# Patient Record
Sex: Male | Born: 1985 | Race: White | Hispanic: No | Marital: Single | Smoking: Current some day smoker
Health system: Southern US, Community
[De-identification: ages and names within clinical notes are randomized; demographics above are authoritative.]

---

## 2011-11-13 ENCOUNTER — Emergency Department: Payer: Self-pay | Admitting: *Deleted

## 2017-12-19 ENCOUNTER — Emergency Department: Payer: BLUE CROSS/BLUE SHIELD

## 2017-12-19 ENCOUNTER — Encounter: Payer: Self-pay | Admitting: Emergency Medicine

## 2017-12-19 ENCOUNTER — Other Ambulatory Visit: Payer: Self-pay

## 2017-12-19 ENCOUNTER — Emergency Department
Admission: EM | Admit: 2017-12-19 | Discharge: 2017-12-19 | Disposition: A | Discharge status: Home / Self Care | Payer: BLUE CROSS/BLUE SHIELD | Attending: Emergency Medicine | Admitting: Emergency Medicine

## 2017-12-19 DIAGNOSIS — Y999 Unspecified external cause status: Secondary | ICD-10-CM | POA: Insufficient documentation

## 2017-12-19 DIAGNOSIS — F17200 Nicotine dependence, unspecified, uncomplicated: Secondary | ICD-10-CM | POA: Insufficient documentation

## 2017-12-19 DIAGNOSIS — M542 Cervicalgia: Secondary | ICD-10-CM | POA: Insufficient documentation

## 2017-12-19 DIAGNOSIS — Y9389 Activity, other specified: Secondary | ICD-10-CM | POA: Diagnosis not present

## 2017-12-19 DIAGNOSIS — Y92410 Unspecified street and highway as the place of occurrence of the external cause: Secondary | ICD-10-CM | POA: Insufficient documentation

## 2017-12-19 DIAGNOSIS — M7918 Myalgia, other site: Secondary | ICD-10-CM | POA: Diagnosis not present

## 2017-12-19 MED ORDER — CYCLOBENZAPRINE HCL 10 MG PO TABS
10.0000 mg | ORAL_TABLET | Freq: Once | ORAL | Status: AC
  Administered 2017-12-19: 10 mg via ORAL
  Filled 2017-12-19: qty 1

## 2017-12-19 MED ORDER — HYDROCODONE-ACETAMINOPHEN 5-325 MG PO TABS
1.0000 | ORAL_TABLET | Freq: Once | ORAL | Status: AC
  Administered 2017-12-19: 1 via ORAL
  Filled 2017-12-19: qty 1

## 2017-12-19 MED ORDER — HYDROCODONE-ACETAMINOPHEN 5-325 MG PO TABS: 1.0000 | ORAL_TABLET | Freq: Four times a day (QID) | ORAL | 0 refills | Status: DC | PRN

## 2017-12-19 MED ORDER — KETOROLAC TROMETHAMINE 10 MG PO TABS: 10.0000 mg | ORAL_TABLET | Freq: Three times a day (TID) | ORAL | 0 refills | Status: AC

## 2017-12-19 MED ORDER — CYCLOBENZAPRINE HCL 5 MG PO TABS: 5.0000 mg | ORAL_TABLET | Freq: Three times a day (TID) | ORAL | 0 refills | Status: AC | PRN

## 2017-12-19 MED ORDER — KETOROLAC TROMETHAMINE 30 MG/ML IJ SOLN
30.0000 mg | Freq: Once | INTRAMUSCULAR | Status: AC
  Administered 2017-12-19: 30 mg via INTRAMUSCULAR
  Filled 2017-12-19: qty 1
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
## 2017-12-22 ENCOUNTER — Emergency Department
Admission: EM | Admit: 2017-12-22 | Discharge: 2017-12-22 | Disposition: A | Discharge status: Home / Self Care | Payer: BLUE CROSS/BLUE SHIELD | Attending: Emergency Medicine | Admitting: Emergency Medicine

## 2017-12-22 ENCOUNTER — Emergency Department: Payer: BLUE CROSS/BLUE SHIELD

## 2017-12-22 ENCOUNTER — Other Ambulatory Visit: Payer: Self-pay

## 2017-12-22 DIAGNOSIS — Y999 Unspecified external cause status: Secondary | ICD-10-CM | POA: Insufficient documentation

## 2017-12-22 DIAGNOSIS — Y92410 Unspecified street and highway as the place of occurrence of the external cause: Secondary | ICD-10-CM | POA: Insufficient documentation

## 2017-12-22 DIAGNOSIS — F17200 Nicotine dependence, unspecified, uncomplicated: Secondary | ICD-10-CM | POA: Insufficient documentation

## 2017-12-22 DIAGNOSIS — Y9389 Activity, other specified: Secondary | ICD-10-CM | POA: Diagnosis not present

## 2017-12-22 DIAGNOSIS — R1012 Left upper quadrant pain: Secondary | ICD-10-CM | POA: Diagnosis not present

## 2017-12-22 LAB — CBC
HCT: 45.1 % (ref 40.0–52.0)
Hemoglobin: 15.7 g/dL (ref 13.0–18.0)
MCH: 30.6 pg (ref 26.0–34.0)
MCHC: 34.9 g/dL (ref 32.0–36.0)
MCV: 87.8 fL (ref 80.0–100.0)
Platelets: 245 K/uL (ref 150–440)
RBC: 5.14 MIL/uL (ref 4.40–5.90)
RDW: 12.3 % (ref 11.5–14.5)
WBC: 10.7 K/uL — ABNORMAL HIGH (ref 3.8–10.6)

## 2017-12-22 LAB — BASIC METABOLIC PANEL
Anion gap: 9 (ref 5–15)
BUN: 11 mg/dL (ref 6–20)
CO2: 25 mmol/L (ref 22–32)
CREATININE: 0.99 mg/dL (ref 0.61–1.24)
Calcium: 9.5 mg/dL (ref 8.9–10.3)
Chloride: 101 mmol/L (ref 101–111)
Glucose, Bld: 90 mg/dL (ref 65–99)
Potassium: 3.6 mmol/L (ref 3.5–5.1)
SODIUM: 135 mmol/L (ref 135–145)

## 2017-12-22 LAB — TROPONIN I: Troponin I: <0.03 ng/mL

## 2017-12-22 MED ORDER — IOPAMIDOL (ISOVUE-300) INJECTION 61%
100.0000 mL | Freq: Once | INTRAVENOUS | Status: AC | PRN
  Administered 2017-12-22: 100 mL via INTRAVENOUS
  Filled 2017-12-22: qty 100
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
## 2019-07-07 ENCOUNTER — Emergency Department: Payer: BC Managed Care – PPO

## 2019-07-07 ENCOUNTER — Other Ambulatory Visit: Payer: Self-pay

## 2019-07-07 ENCOUNTER — Emergency Department
Admission: EM | Admit: 2019-07-07 | Discharge: 2019-07-08 | Disposition: A | Discharge status: Home / Self Care | Payer: BC Managed Care – PPO | Attending: Emergency Medicine | Admitting: Emergency Medicine

## 2019-07-07 DIAGNOSIS — S098XXA Other specified injuries of head, initial encounter: Secondary | ICD-10-CM | POA: Insufficient documentation

## 2019-07-07 DIAGNOSIS — S2242XA Multiple fractures of ribs, left side, initial encounter for closed fracture: Secondary | ICD-10-CM

## 2019-07-07 DIAGNOSIS — S0083XA Contusion of other part of head, initial encounter: Secondary | ICD-10-CM | POA: Diagnosis not present

## 2019-07-07 DIAGNOSIS — Y999 Unspecified external cause status: Secondary | ICD-10-CM | POA: Insufficient documentation

## 2019-07-07 DIAGNOSIS — F17200 Nicotine dependence, unspecified, uncomplicated: Secondary | ICD-10-CM | POA: Insufficient documentation

## 2019-07-07 DIAGNOSIS — Z79899 Other long term (current) drug therapy: Secondary | ICD-10-CM | POA: Diagnosis not present

## 2019-07-07 DIAGNOSIS — S92424A Nondisplaced fracture of distal phalanx of right great toe, initial encounter for closed fracture: Secondary | ICD-10-CM

## 2019-07-07 DIAGNOSIS — S22080A Wedge compression fracture of T11-T12 vertebra, initial encounter for closed fracture: Secondary | ICD-10-CM | POA: Diagnosis not present

## 2019-07-07 DIAGNOSIS — S82831A Other fracture of upper and lower end of right fibula, initial encounter for closed fracture: Secondary | ICD-10-CM | POA: Diagnosis not present

## 2019-07-07 DIAGNOSIS — S8991XA Unspecified injury of right lower leg, initial encounter: Secondary | ICD-10-CM | POA: Diagnosis present

## 2019-07-07 DIAGNOSIS — Y9389 Activity, other specified: Secondary | ICD-10-CM | POA: Insufficient documentation

## 2019-07-07 DIAGNOSIS — Y92410 Unspecified street and highway as the place of occurrence of the external cause: Secondary | ICD-10-CM | POA: Insufficient documentation

## 2019-07-07 DIAGNOSIS — R1012 Left upper quadrant pain: Secondary | ICD-10-CM | POA: Insufficient documentation

## 2019-07-07 DIAGNOSIS — S0081XA Abrasion of other part of head, initial encounter: Secondary | ICD-10-CM | POA: Diagnosis not present

## 2019-07-07 LAB — COMPREHENSIVE METABOLIC PANEL
ALT: 22 U/L (ref 0–44)
AST: 38 U/L (ref 15–41)
Albumin: 4.6 g/dL (ref 3.5–5.0)
Alkaline Phosphatase: 48 U/L (ref 38–126)
Anion gap: 11 (ref 5–15)
BUN: 16 mg/dL (ref 6–20)
CO2: 23 mmol/L (ref 22–32)
Calcium: 9.2 mg/dL (ref 8.9–10.3)
Chloride: 103 mmol/L (ref 98–111)
Creatinine, Ser: 1.21 mg/dL (ref 0.61–1.24)
GFR calc Af Amer: >60 mL/min (ref >60)
GFR calc non Af Amer: >60 mL/min (ref >60)
Glucose, Bld: 125 mg/dL — ABNORMAL HIGH (ref 70–99)
Potassium: 3.5 mmol/L (ref 3.5–5.1)
Sodium: 137 mmol/L (ref 135–145)
Total Bilirubin: 0.6 mg/dL (ref 0.3–1.2)
Total Protein: 8.2 g/dL — ABNORMAL HIGH (ref 6.5–8.1)

## 2019-07-07 LAB — CBC WITH DIFFERENTIAL/PLATELET
Abs Immature Granulocytes: 0.34 10*3/uL — ABNORMAL HIGH (ref 0.00–0.07)
Basophils Absolute: 0.1 10*3/uL (ref 0.0–0.1)
Basophils Relative: 0 %
Eosinophils Absolute: 0 10*3/uL (ref 0.0–0.5)
Eosinophils Relative: 0 %
HCT: 44.1 % (ref 39.0–52.0)
Hemoglobin: 15.2 g/dL (ref 13.0–17.0)
Immature Granulocytes: 1 %
Lymphocytes Relative: 7 %
Lymphs Abs: 2 10*3/uL (ref 0.7–4.0)
MCH: 30.2 pg (ref 26.0–34.0)
MCHC: 34.5 g/dL (ref 30.0–36.0)
MCV: 87.5 fL (ref 80.0–100.0)
Monocytes Absolute: 1.6 10*3/uL — ABNORMAL HIGH (ref 0.1–1.0)
Monocytes Relative: 6 %
Neutro Abs: 23 10*3/uL — ABNORMAL HIGH (ref 1.7–7.7)
Neutrophils Relative %: 86 %
Platelets: 305 10*3/uL (ref 150–400)
RBC: 5.04 MIL/uL (ref 4.22–5.81)
RDW: 12 % (ref 11.5–15.5)
WBC: 27 10*3/uL — ABNORMAL HIGH (ref 4.0–10.5)
nRBC: 0 % (ref 0.0–0.2)

## 2019-07-07 LAB — TYPE AND SCREEN
ABO/RH(D): O POS
Antibody Screen: NEGATIVE

## 2019-07-07 LAB — APTT: aPTT: 26 seconds (ref 24–36)

## 2019-07-07 LAB — PROTIME-INR
INR: 1 (ref 0.8–1.2)
Prothrombin Time: 13.2 seconds (ref 11.4–15.2)

## 2019-07-07 MED ORDER — ONDANSETRON HCL 4 MG/2ML IJ SOLN
4.0000 mg | Freq: Once | INTRAMUSCULAR | Status: AC
  Administered 2019-07-07: 22:00:00 4 mg via INTRAVENOUS
  Filled 2019-07-07: qty 2

## 2019-07-07 MED ORDER — IOHEXOL 300 MG/ML  SOLN
100.0000 mL | Freq: Once | INTRAMUSCULAR | Status: AC | PRN
  Administered 2019-07-07: 23:00:00 100 mL via INTRAVENOUS

## 2019-07-07 MED ORDER — MORPHINE SULFATE (PF) 4 MG/ML IV SOLN
4.0000 mg | Freq: Once | INTRAVENOUS | Status: AC
  Administered 2019-07-07: 22:00:00 4 mg via INTRAVENOUS
  Filled 2019-07-07: qty 1

## 2019-07-07 MED ORDER — SODIUM CHLORIDE 0.9 % IV SOLN
1000.0000 mL | Freq: Once | INTRAVENOUS | Status: AC
  Administered 2019-07-07: 1000 mL via INTRAVENOUS

## 2019-07-07 MED ORDER — DIPHENHYDRAMINE HCL 50 MG/ML IJ SOLN
50.0000 mg | Freq: Once | INTRAMUSCULAR | Status: AC
  Administered 2019-07-07: 23:00:00 50 mg via INTRAVENOUS
  Filled 2019-07-07: qty 1
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
## 2019-07-08 ENCOUNTER — Emergency Department: Payer: BC Managed Care – PPO

## 2019-07-08 MED ORDER — HYDROCODONE-ACETAMINOPHEN 5-325 MG PO TABS
2.0000 | ORAL_TABLET | Freq: Once | ORAL | Status: AC
  Administered 2019-07-08: 02:00:00 2 via ORAL
  Filled 2019-07-08: qty 2

## 2019-07-08 MED ORDER — MORPHINE SULFATE (PF) 2 MG/ML IV SOLN
2.0000 mg | Freq: Once | INTRAVENOUS | Status: AC
  Administered 2019-07-08: 01:00:00 2 mg via INTRAVENOUS
  Filled 2019-07-08: qty 1

## 2019-07-08 MED ORDER — HYDROCODONE-ACETAMINOPHEN 5-325 MG PO TABS: 1.0000 | ORAL_TABLET | Freq: Four times a day (QID) | ORAL | 0 refills | Status: AC | PRN

## 2019-07-08 NOTE — Discharge Instructions (Addendum)
°  Call your doctor or return to the Emergency Department (ED)  if you develop a sudden or severe headache, confusion, slurred speech, facial droop, weakness or numbness in any arm or leg,  extreme fatigue, vomiting more than two times, severe abdominal pain, or other symptoms that concern you.
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
